# Patient Record
Sex: Male | Born: 1990 | Race: Black or African American | Hispanic: No | Marital: Married | Smoking: Never smoker
Health system: Southern US, Community
[De-identification: ages and names within clinical notes are randomized; demographics above are authoritative.]

## PROBLEM LIST (undated history)

## (undated) HISTORY — PX: WRIST SURGERY: SHX841

## (undated) HISTORY — PX: KNEE SURGERY: SHX244

---
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
## 2014-12-02 ENCOUNTER — Emergency Department (HOSPITAL_BASED_OUTPATIENT_CLINIC_OR_DEPARTMENT_OTHER)
Admission: EM | Admit: 2014-12-02 | Discharge: 2014-12-02 | Disposition: A | Discharge status: Home / Self Care | Payer: Self-pay | Attending: Emergency Medicine | Admitting: Emergency Medicine

## 2014-12-02 ENCOUNTER — Encounter (HOSPITAL_BASED_OUTPATIENT_CLINIC_OR_DEPARTMENT_OTHER): Payer: Self-pay

## 2014-12-02 DIAGNOSIS — Z880 Allergy status to penicillin: Secondary | ICD-10-CM | POA: Insufficient documentation

## 2014-12-02 DIAGNOSIS — R0982 Postnasal drip: Secondary | ICD-10-CM | POA: Insufficient documentation

## 2014-12-02 DIAGNOSIS — J069 Acute upper respiratory infection, unspecified: Secondary | ICD-10-CM | POA: Insufficient documentation

## 2014-12-02 MED ORDER — BENZONATATE 100 MG PO CAPS
200.0000 mg | ORAL_CAPSULE | Freq: Once | ORAL | Status: AC
  Administered 2014-12-02: 200 mg via ORAL
  Filled 2014-12-02: qty 2

## 2014-12-02 MED ORDER — GUAIFENESIN 400 MG PO TABS: 400.0000 mg | ORAL_TABLET | Freq: Four times a day (QID) | ORAL | Status: DC

## 2014-12-02 MED ORDER — IBUPROFEN 800 MG PO TABS
800.0000 mg | ORAL_TABLET | Freq: Once | ORAL | Status: AC
  Administered 2014-12-02: 800 mg via ORAL
  Filled 2014-12-02: qty 1

## 2014-12-02 MED ORDER — LORATADINE 10 MG PO TABS
10.0000 mg | ORAL_TABLET | Freq: Once | ORAL | Status: AC
  Administered 2014-12-02: 10 mg via ORAL
  Filled 2014-12-02: qty 1

## 2014-12-02 MED ORDER — BENZONATATE 100 MG PO CAPS: 100.0000 mg | ORAL_CAPSULE | Freq: Three times a day (TID) | ORAL | Status: DC

## 2014-12-02 MED ORDER — FLUTICASONE PROPIONATE 50 MCG/ACT NA SUSP: 2.0000 | Freq: Every day | NASAL | Status: DC

## 2014-12-02 MED ORDER — LORATADINE 10 MG PO TABS
ORAL_TABLET | ORAL | Status: AC
  Filled 2014-12-02: qty 1

## 2014-12-02 MED ORDER — CETIRIZINE-PSEUDOEPHEDRINE ER 5-120 MG PO TB12: 1.0000 | ORAL_TABLET | Freq: Two times a day (BID) | ORAL | Status: DC

## 2014-12-02 NOTE — ED Provider Notes (Signed)
CSN: [id number]     Arrival date & time 12/02/14  0106 History   First MD Initiated Contact with Patient 12/02/14 0124     Chief Complaint  Patient presents with  . Cough     (Consider location/radiation/quality/duration/timing/severity/associated sxs/prior Treatment) Patient is a 24 y.o. male presenting with URI. The history is provided by the patient.  URI Presenting symptoms: congestion and rhinorrhea   Presenting symptoms: no fever and no sore throat   Congestion:    Location:  Nasal   Interferes with sleep: no     Interferes with eating/drinking: no   Severity:  Moderate Onset quality:  Gradual Duration:  2 days Timing:  Constant Progression:  Unchanged Chronicity:  New Relieved by:  Nothing Worsened by:  Nothing tried Ineffective treatments:  OTC medications (theraflu) Associated symptoms: no headaches, no myalgias, no neck pain, no swollen glands and no wheezing   Risk factors: not elderly   Also dry cough worse lying flat  History reviewed. No pertinent past medical history. History reviewed. No pertinent past surgical history. History reviewed. No pertinent family history. History  Substance Use Topics  . Smoking status: Never Smoker   . Smokeless tobacco: Not on file  . Alcohol Use: Yes     Comment: social    Review of Systems  Constitutional: Negative for fever.  HENT: Positive for congestion and rhinorrhea. Negative for facial swelling and sore throat.   Respiratory: Negative for shortness of breath and wheezing.   Cardiovascular: Negative for chest pain.  Musculoskeletal: Negative for myalgias and neck pain.  Neurological: Negative for headaches.  All other systems reviewed and are negative.     Allergies  Amoxicillin  Home Medications   Prior to Admission medications   Not on File   BP 140/59 mmHg  Pulse 84  Temp(Src) 98.6 F (37 C) (Oral)  Resp 20  Ht 6\' 1"  (1.854 m)  Wt 280 lb (127.007 kg)  BMI 36.95 kg/m2  SpO2 98% Physical Exam   Constitutional: He is oriented to person, place, and time. He appears well-developed and well-nourished. No distress.  HENT:  Head: Normocephalic and atraumatic.  Right Ear: External ear normal.  Left Ear: External ear normal.  Mouth/Throat: Oropharynx is clear and moist.  Clear colorless post nasal drip  Eyes: Conjunctivae are normal. Pupils are equal, round, and reactive to light.  Neck: Normal range of motion. Neck supple.  Cardiovascular: Normal rate, regular rhythm and intact distal pulses.   Pulmonary/Chest: Effort normal and breath sounds normal. No stridor. No respiratory distress. He has no wheezes. He has no rales.  Abdominal: Soft. Bowel sounds are normal. There is no tenderness. There is no rebound and no guarding.  Musculoskeletal: Normal range of motion.  Lymphadenopathy:    He has no cervical adenopathy.  Neurological: He is alert and oriented to person, place, and time.  Skin: Skin is warm and dry.  Psychiatric: He has a normal mood and affect.    ED Course  Procedures (including critical care time) Labs Review Labs Reviewed - No data to display  Imaging Review No results found.   EKG Interpretation None      MDM   Final diagnoses:  None    URI with post nasal drip.  Will treat symptomatically with decongestant, flonase and tessalon.  Return for fevers chills stiff neck or any concerns    Camil Wilhelmsen K Makelle Marrone-Rasch, MD 12/02/14 [phone]

## 2014-12-02 NOTE — ED Notes (Signed)
Pt c/o cough x3days, taken OTC meds with no relief, states coughing till he vomits

## 2015-06-13 ENCOUNTER — Encounter (HOSPITAL_BASED_OUTPATIENT_CLINIC_OR_DEPARTMENT_OTHER): Payer: Self-pay | Admitting: *Deleted

## 2015-06-13 ENCOUNTER — Emergency Department (HOSPITAL_BASED_OUTPATIENT_CLINIC_OR_DEPARTMENT_OTHER): Payer: Self-pay

## 2015-06-13 ENCOUNTER — Emergency Department (HOSPITAL_BASED_OUTPATIENT_CLINIC_OR_DEPARTMENT_OTHER)
Admission: EM | Admit: 2015-06-13 | Discharge: 2015-06-13 | Disposition: A | Discharge status: Home / Self Care | Payer: Self-pay | Attending: Emergency Medicine | Admitting: Emergency Medicine

## 2015-06-13 DIAGNOSIS — S93601A Unspecified sprain of right foot, initial encounter: Secondary | ICD-10-CM | POA: Insufficient documentation

## 2015-06-13 DIAGNOSIS — Y9362 Activity, american flag or touch football: Secondary | ICD-10-CM | POA: Insufficient documentation

## 2015-06-13 DIAGNOSIS — Y998 Other external cause status: Secondary | ICD-10-CM | POA: Insufficient documentation

## 2015-06-13 DIAGNOSIS — Z880 Allergy status to penicillin: Secondary | ICD-10-CM | POA: Insufficient documentation

## 2015-06-13 DIAGNOSIS — Z7951 Long term (current) use of inhaled steroids: Secondary | ICD-10-CM | POA: Insufficient documentation

## 2015-06-13 DIAGNOSIS — X58XXXA Exposure to other specified factors, initial encounter: Secondary | ICD-10-CM | POA: Insufficient documentation

## 2015-06-13 DIAGNOSIS — Z79899 Other long term (current) drug therapy: Secondary | ICD-10-CM | POA: Insufficient documentation

## 2015-06-13 DIAGNOSIS — Y92321 Football field as the place of occurrence of the external cause: Secondary | ICD-10-CM | POA: Insufficient documentation

## 2015-06-13 MED ORDER — NAPROXEN 500 MG PO TABS: 500.0000 mg | ORAL_TABLET | Freq: Two times a day (BID) | ORAL | Status: DC

## 2015-06-13 NOTE — ED Notes (Signed)
Right foot injury while playing football last week.

## 2015-06-13 NOTE — ED Provider Notes (Signed)
CSN: [id number]     Arrival date & time 06/13/15  1734 History   First MD Initiated Contact with Patient 06/13/15 1851     Chief Complaint  Patient presents with  . Foot Injury     (Consider location/radiation/quality/duration/timing/severity/associated sxs/prior Treatment) HPI Martin Sullivan is a 24 y.o. male with no medical problems, presents to ED with right foot pain. Patient states he is playing football and twisted his foot a week ago. Patient states that it has been swollen and painful since then. States he is able to walk on it. He denies taking any medications for it. No treatment prior to coming in. States "it's not getting any better so decided to come to get it checked out." Denies any numbness or weakness. There is no other injuries.   History reviewed. No pertinent past medical history. Past Surgical History  Procedure Laterality Date  . Wrist surgery    . Knee surgery      x2   No family history on file. History  Substance Use Topics  . Smoking status: Never Smoker   . Smokeless tobacco: Not on file  . Alcohol Use: No     Comment: social    Review of Systems  Constitutional: Negative for fever and chills.  Musculoskeletal: Positive for joint swelling and arthralgias.  Neurological: Negative for weakness and numbness.      Allergies  Amoxicillin  Home Medications   Prior to Admission medications   Medication Sig Start Date End Date Taking? Authorizing Provider  benzonatate (TESSALON) 100 MG capsule Take 1 capsule (100 mg total) by mouth every 8 (eight) hours. 12/02/14   April Palumbo, MD  cetirizine-pseudoephedrine (ZYRTEC-D) 5-120 MG per tablet Take 1 tablet by mouth 2 (two) times daily. 12/02/14   April Palumbo, MD  fluticasone [facility]) 50 MCG/ACT nasal spray Place 2 sprays into both nostrils daily. 12/02/14   April Palumbo, MD  guaifenesin (HUMIBID E) 400 MG TABS tablet Take 1 tablet (400 mg total) by mouth every 6 (six) hours. 12/02/14   April Palumbo,  MD   BP 112/82 mmHg  Pulse 64  Temp(Src) 98.7 F (37.1 C) (Oral)  Resp 18  Ht 6\' 2"  (1.88 m)  Wt 270 lb (122.471 kg)  BMI 34.65 kg/m2  SpO2 98% Physical Exam  Constitutional: He is oriented to person, place, and time. He appears well-developed and well-nourished. No distress.  HENT:  Head: Normocephalic.  Eyes: Conjunctivae are normal.  Neck: Neck supple.  Musculoskeletal:  Mild swelling noted to dorsal right foot. TTP over 2nd and 3rd metatarsals. Pain with flexion and extension of 2nd and 3rd toes at MTP joints. dp pulses intact  Neurological: He is alert and oriented to person, place, and time.  Skin: Skin is warm and dry.  Nursing note and vitals reviewed.   ED Course  Procedures (including critical care time) Labs Review Labs Reviewed - No data to display  Imaging Review Dg Foot Complete Right  06/13/2015   CLINICAL DATA:  Right foot injury 8 days ago playing football-got better last week then played basketball and foot started swelling again; pain is distal tarsals  EXAM: RIGHT FOOT COMPLETE - 3+ VIEW  COMPARISON:  None.  FINDINGS: There is no evidence of fracture or dislocation. There is no evidence of arthropathy or other focal bone abnormality. Soft tissues are unremarkable.  IMPRESSION: Negative.   Electronically Signed   By: Esperanza Heir M.D.   On: 06/13/2015 18:35     EKG Interpretation None  MDM   Final diagnoses:  Right foot sprain, initial encounter     Patient is here with foot pain and swelling, after twisting injury one week ago. X-rays negative. Most likely a foot sprain. Patient is able to or on the foot. Ace wrap applied, home with naproxen, elevation, Ace, follow-up with orthopedic specialist.  Filed Vitals:   06/13/15 1754 06/13/15 1950  BP: 112/82 144/77  Pulse: 64 88  Temp: 98.7 F (37.1 C)   TempSrc: Oral   Resp: 18   Height:  (1.88 m)   Weight: 270 lb (122.471 kg)   SpO2: 98% 100%     Jaynie Crumble,  PA-C 06/14/15 0105  Jerelyn Scott, MD 06/14/15 1513

## 2015-06-13 NOTE — Discharge Instructions (Signed)
Keep your foot elevated. Ice several times a day. Naprosyn for pain and inflammation. Follow up with orthopedics.    Foot Sprain The muscles and cord like structures which attach muscle to bone (tendons) that surround the feet are made up of units. A foot sprain can occur at the weakest spot in any of these units. This condition is most often caused by injury to or overuse of the foot, as from playing contact sports, or aggravating a previous injury, or from poor conditioning, or obesity. SYMPTOMS  Pain with movement of the foot.  Tenderness and swelling at the injury site.  Loss of strength is present in moderate or severe sprains. THE THREE GRADES OR SEVERITY OF FOOT SPRAIN ARE:  Mild (Grade I): Slightly pulled muscle without tearing of muscle or tendon fibers or loss of strength.  Moderate (Grade II): Tearing of fibers in a muscle, tendon, or at the attachment to bone, with small decrease in strength.  Severe (Grade III): Rupture of the muscle-tendon-bone attachment, with separation of fibers. Severe sprain requires surgical repair. Often repeating (chronic) sprains are caused by overuse. Sudden (acute) sprains are caused by direct injury or over-use. DIAGNOSIS  Diagnosis of this condition is usually by your own observation. If problems continue, a caregiver may be required for further evaluation and treatment. X-rays may be required to make sure there are not breaks in the bones (fractures) present. Continued problems may require physical therapy for treatment. PREVENTION  Use strength and conditioning exercises appropriate for your sport.  Warm up properly prior to working out.  Use athletic shoes that are made for the sport you are participating in.  Allow adequate time for healing. Early return to activities makes repeat injury more likely, and can lead to an unstable arthritic foot that can result in prolonged disability. Mild sprains generally heal in 3 to 10 days, with moderate  and severe sprains taking 2 to 10 weeks. Your caregiver can help you determine the proper time required for healing. HOME CARE INSTRUCTIONS   Apply ice to the injury for 15-20 minutes, 03-04 times per day. Put the ice in a plastic bag and place a towel between the bag of ice and your skin.  An elastic wrap (like an Ace bandage) may be used to keep swelling down.  Keep foot above the level of the heart, or at least raised on a footstool, when swelling and pain are present.  Try to avoid use other than gentle range of motion while the foot is painful. Do not resume use until instructed by your caregiver. Then begin use gradually, not increasing use to the point of pain. If pain does develop, decrease use and continue the above measures, gradually increasing activities that do not cause discomfort, until you gradually achieve normal use.  Use crutches if and as instructed, and for the length of time instructed.  Keep injured foot and ankle wrapped between treatments.  Massage foot and ankle for comfort and to keep swelling down. Massage from the toes up towards the knee.  Only take over-the-counter or prescription medicines for pain, discomfort, or fever as directed by your caregiver. SEEK IMMEDIATE MEDICAL CARE IF:   Your pain and swelling increase, or pain is not controlled with medications.  You have loss of feeling in your foot or your foot turns cold or blue.  You develop new, unexplained symptoms, or an increase of the symptoms that brought you to your caregiver. MAKE SURE YOU:   Understand these instructions.  Will watch your condition.  Will get help right away if you are not doing well or get worse. Document Released: 04/20/2002 Document Revised: 01/21/2012 Document Reviewed: 06/17/2008 [facility] Patient Information 2015 [place], [place]. This information is not intended to replace advice given to you by your health care provider. Make sure you discuss any questions you have with  your health care provider.

## 2017-01-04 ENCOUNTER — Emergency Department (HOSPITAL_BASED_OUTPATIENT_CLINIC_OR_DEPARTMENT_OTHER)
Admission: EM | Admit: 2017-01-04 | Discharge: 2017-01-04 | Disposition: A | Discharge status: Home / Self Care | Payer: Self-pay | Attending: Emergency Medicine | Admitting: Emergency Medicine

## 2017-01-04 ENCOUNTER — Encounter (HOSPITAL_BASED_OUTPATIENT_CLINIC_OR_DEPARTMENT_OTHER): Payer: Self-pay

## 2017-01-04 DIAGNOSIS — A084 Viral intestinal infection, unspecified: Secondary | ICD-10-CM | POA: Insufficient documentation

## 2017-01-04 MED ORDER — ONDANSETRON 8 MG PO TBDP
8.0000 mg | ORAL_TABLET | Freq: Once | ORAL | Status: AC
  Administered 2017-01-04: 8 mg via ORAL
  Filled 2017-01-04: qty 1

## 2017-01-04 MED ORDER — LOPERAMIDE HCL 2 MG PO TABS: ORAL_TABLET | ORAL | Status: AC

## 2017-01-04 MED ORDER — LOPERAMIDE HCL 2 MG PO CAPS
4.0000 mg | ORAL_CAPSULE | Freq: Once | ORAL | Status: AC
  Administered 2017-01-04: 4 mg via ORAL
  Filled 2017-01-04: qty 2

## 2017-01-04 MED ORDER — ONDANSETRON 8 MG PO TBDP
8.0000 mg | ORAL_TABLET | Freq: Three times a day (TID) | ORAL | 0 refills | Status: AC | PRN
Start: 2017-01-04 — End: ?

## 2017-01-04 NOTE — ED Provider Notes (Signed)
   [facility] Provider Note: Martin DellJ. Martin Daisja Kessinger, MD, FACEP  CSN: [id number] MRN: [id number] ARRIVAL: 01/04/17 at 0133 ROOM: MH09/MH09   CHIEF COMPLAINT  Vomiting   HISTORY OF PRESENT ILLNESS  Martin Sullivan is a 26 y.o. male Martin Sullivan had gastroenteritis for the past several days. He is here with nausea, vomiting and diarrhea that began yesterday evening about 7 PM. He has vomited 4 times and has had diarrhea 3 times. He has been able to keep fluids down. He has had some mild abdominal cramping that is relieved by vomiting. He denies fever. He was given Zofran ODT by nursing staff on arrival.   History reviewed. No pertinent past medical history.  Past Surgical History:  Procedure Laterality Date  . KNEE SURGERY     x2  . WRIST SURGERY      No family history on file.  Social History  Substance Use Topics  . Smoking status: Never Smoker  . Smokeless tobacco: Not on file  . Alcohol use No     Comment: social    Prior to Admission medications   Not on File    Allergies Amoxicillin   REVIEW OF SYSTEMS  Negative except as noted here or in the History of Present Illness.   PHYSICAL EXAMINATION  Initial Vital Signs Blood pressure 168/87, pulse 80, temperature 98.3 F (36.8 C), temperature source Oral, resp. rate 18, height 6\' 2"  (1.88 m), weight 295 lb (133.8 kg), SpO2 100 %.  Examination General: Well-developed, well-nourished male in no acute distress; appearance consistent with age of record HENT: normocephalic; atraumatic Eyes: pupils equal, round and reactive to light; extraocular muscles intact Neck: supple Heart: regular rate and rhythm Lungs: clear to auscultation bilaterally Abdomen: soft; nondistended; nontender; no masses or hepatosplenomegaly; bowel sounds present Extremities: No deformity; full range of motion; pulses normal Neurologic: Awake, alert and oriented; motor function intact in all extremities and symmetric; no facial droop Skin:  Warm and dry Psychiatric: Normal mood and affect   RESULTS  Summary of this visit's results, reviewed by myself:   EKG Interpretation  Date/Time:    Ventricular Rate:    PR Interval:    QRS Duration:   QT Interval:    QTC Calculation:   R Axis:     Text Interpretation:        Laboratory Studies: No results found for this or any previous visit (from the past 24 hour(s)). Imaging Studies: No results found.  ED COURSE  Nursing notes and initial vitals signs, including pulse oximetry, reviewed.  Vitals:   01/04/17 0142  BP: 168/87  Pulse: 80  Resp: 18  Temp: 98.3 F (36.8 C)  TempSrc: Oral  SpO2: 100%  Weight: 295 lb (133.8 kg)  Height: 6\' 2"  (1.88 m)    PROCEDURES    ED DIAGNOSES     ICD-9-CM ICD-10-CM   1. Viral gastroenteritis 008.8 A08.4        Martin LibraJohn Mika Griffitts, MD 01/04/17 [id number]

## 2017-01-04 NOTE — ED Triage Notes (Signed)
Pt c/o 4 episodes of vomiting with diarrhea for about 4 hours.  His whole family has the same thing.  Pt has been able to tolerate water and powerade without throwing up, no fevers.

## 2017-01-04 NOTE — ED Notes (Signed)
Pt verbalizes understanding of d/c instructions and denies any further need at this time. 

## 2019-08-05 ENCOUNTER — Other Ambulatory Visit: Payer: Self-pay

## 2019-08-05 ENCOUNTER — Encounter (HOSPITAL_BASED_OUTPATIENT_CLINIC_OR_DEPARTMENT_OTHER): Payer: Self-pay

## 2019-08-05 ENCOUNTER — Emergency Department (HOSPITAL_BASED_OUTPATIENT_CLINIC_OR_DEPARTMENT_OTHER)
Admission: EM | Admit: 2019-08-05 | Discharge: 2019-08-05 | Disposition: A | Discharge status: Home / Self Care | Payer: Self-pay | Attending: Emergency Medicine | Admitting: Emergency Medicine

## 2019-08-05 DIAGNOSIS — M545 Low back pain, unspecified: Secondary | ICD-10-CM

## 2019-08-05 DIAGNOSIS — Z79899 Other long term (current) drug therapy: Secondary | ICD-10-CM | POA: Insufficient documentation

## 2019-08-05 MED ORDER — METHOCARBAMOL 500 MG PO TABS: 500.0000 mg | ORAL_TABLET | Freq: Every evening | ORAL | 0 refills | Status: AC | PRN

## 2019-08-05 MED ORDER — NAPROXEN 250 MG PO TABS
500.0000 mg | ORAL_TABLET | Freq: Once | ORAL | Status: AC
  Administered 2019-08-05: 500 mg via ORAL
  Filled 2019-08-05: qty 2

## 2019-08-05 MED ORDER — NAPROXEN 500 MG PO TABS: 500.0000 mg | ORAL_TABLET | Freq: Two times a day (BID) | ORAL | 0 refills | Status: AC

## 2019-08-05 MED ORDER — LIDOCAINE 5 % EX PTCH
1.0000 | MEDICATED_PATCH | CUTANEOUS | Status: DC
  Filled 2019-08-05: qty 1

## 2019-08-05 NOTE — Discharge Instructions (Signed)
Take naproxen 2 times a day with meals.  Do not take other anti-inflammatories at the same time (Advil, Motrin, ibuprofen, Aleve). You may supplement with Tylenol if you need further pain control. Use Robaxin as needed for muscle stiffness or soreness. Have caution, as this may make you tired or groggy. Do not drive or operate heavy machinery while taking this medication.  Use muscle creams (bengay, icy hot, salonpas) as needed for pain.  Use heat/ice to help with your pain.  Do the stretches in the paperwork 2 times a day.  Follow up with the orthopedic doctor if pain is not improving with this treatment.  Return to the ER if you develop high fevers, numbness, loss of bowel or bladder control, or any new or concerning symptoms.

## 2019-08-05 NOTE — ED Triage Notes (Addendum)
Pt c/o lower back pain x 1.5 weeks-denies injury-pain worse with movement-NAD-steady gait

## 2019-08-05 NOTE — ED Provider Notes (Signed)
[facility] EMERGENCY DEPARTMENT Provider Note   CSN: [id number] Arrival date & time: 08/05/19  1355     History   Chief Complaint Chief Complaint  Patient presents with  . Back Pain    HPI Martin Sullivan is a 28 y.o. male presenting for evaluation of back pain.   Patient states for the past 1-1/2 weeks, he has had gradually worsening low back pain.  It is on his left side.  It does not radiate anywhere.  He denies fall, trauma, or injury.  Patient states he does a lot of heavy lifting for work, but no known precipitating event.  He took 1 dose of Flexeril without improvement of symptoms, he has not tried anything else.  Sitting and movement makes the pain worse, nothing makes it better.  He had something similar earlier this month which resolved without intervention.  Otherwise, no history of back pain.  He denies fevers, chills, nausea, vomiting, urinary symptoms, numbness, tingling, loss of bowel bladder control, history of cancer, history of IVDU.     HPI  History reviewed. No pertinent past medical history.  There are no active problems to display for this patient.   Past Surgical History:  Procedure Laterality Date  . KNEE SURGERY     x2  . WRIST SURGERY          Home Medications    Prior to Admission medications   Medication Sig Start Date End Date Taking? Authorizing Provider  loperamide (IMODIUM A-D) 2 MG tablet Take per package as structures as needed for diarrhea. 01/04/17   Molpus, Jonny Ruiz, MD  methocarbamol (ROBAXIN) 500 MG tablet Take 1 tablet (500 mg total) by mouth at bedtime as needed for muscle spasms. 08/05/19   Mckensi Redinger, PA-C  naproxen (NAPROSYN) 500 MG tablet Take 1 tablet (500 mg total) by mouth 2 (two) times daily with a meal. 08/05/19   Omar Gayden, PA-C  ondansetron (ZOFRAN ODT) 8 MG disintegrating tablet Take 1 tablet (8 mg total) by mouth every 8 (eight) hours as needed for nausea or vomiting. 01/04/17   Molpus, Jonny Ruiz, MD     Family History No family history on file.  Social History Social History   Tobacco Use  . Smoking status: Never Smoker  . Smokeless tobacco: Never Used  Substance Use Topics  . Alcohol use: Yes    Comment: occ  . Drug use: No     Allergies   Amoxicillin   Review of Systems Review of Systems  Constitutional: Negative for fever.  Musculoskeletal: Positive for back pain.     Physical Exam Updated Vital Signs BP 132/75 (BP Location: Left Arm)   Pulse 70   Temp 99 F (37.2 C) (Oral)   Resp 20   Ht 6\' 1"  (1.854 m)   Wt (!) 137.4 kg   SpO2 100%   BMI 39.98 kg/m   Physical Exam Vitals signs and nursing note reviewed.  Constitutional:      General: He is not in acute distress.    Appearance: He is well-developed.     Comments: Appears nontoxic  HENT:     Head: Normocephalic and atraumatic.  Eyes:     Conjunctiva/sclera: Conjunctivae normal.     Pupils: Pupils are equal, round, and reactive to light.  Neck:     Musculoskeletal: Normal range of motion and neck supple.  Cardiovascular:     Rate and Rhythm: Normal rate and regular rhythm.  Pulmonary:     Effort: Pulmonary effort is  normal. No respiratory distress.     Breath sounds: Normal breath sounds. No wheezing.  Abdominal:     General: There is no distension.     Palpations: Abdomen is soft.     Tenderness: There is no abdominal tenderness.  Musculoskeletal: Normal range of motion.     Comments: Tenderness palpation of left low back musculature.  No increased pain over midline spine.  No step-offs or deformities.  No tenderness palpation of the right side.  Patient is ambulatory no difficulty.  Active range of motion of the hip/back with pain of all movements.   Skin:    General: Skin is warm and dry.     Capillary Refill: Capillary refill takes less than 2 seconds.  Neurological:     Mental Status: He is alert and oriented to person, place, and time.      ED Treatments / Results  Labs (all labs  ordered are listed, but only abnormal results are displayed) Labs Reviewed - No data to display  EKG None  Radiology No results found.  Procedures Procedures (including critical care time)  Medications Ordered in ED Medications  lidocaine (LIDODERM) 5 % 1 patch (has no administration in time range)  naproxen (NAPROSYN) tablet 500 mg (500 mg Oral Given 08/05/19 1540)     Initial Impression / Assessment and Plan / ED Course  I have reviewed the triage vital signs and the nursing notes.  Pertinent labs & imaging results that were available during my care of the patient were reviewed by me and considered in my medical decision making (see chart for details).        Patient presenting for evaluation of low back pain.  Physical exam reassuring, neurovascularly intact.  No red flags for back pain.  Pain is reproducible with palpation of the musculature.  Likely musculoskeletal.  Doubt fracture, I do not believe x-rays will be beneficial.  Doubt vertebral injury, infection, spinal cord compression, myelopathy, or cauda equina syndrome.  Will treat symptomatically with NSAIDs, muscle relaxers, muscle creams.  Patient given information to follow-up with orthopedics.  At this time, patient appears safe for discharge.  Return precautions given.  Patient states he understands agrees plan.  Final Clinical Impressions(s) / ED Diagnoses   Final diagnoses:  Acute left-sided low back pain without sciatica    ED Discharge Orders         Ordered    naproxen (NAPROSYN) 500 MG tablet  2 times daily with meals     08/05/19 1534    methocarbamol (ROBAXIN) 500 MG tablet  At bedtime PRN     08/05/19 1534           Laconda Basich, PA-C 08/05/19 1814    Julianne Rice, MD 08/05/19 2008

## 2019-10-16 ENCOUNTER — Other Ambulatory Visit: Payer: Self-pay

## 2019-10-16 DIAGNOSIS — Z20822 Contact with and (suspected) exposure to covid-19: Secondary | ICD-10-CM

## 2019-10-18 ENCOUNTER — Telehealth: Payer: Self-pay

## 2019-10-18 NOTE — Telephone Encounter (Signed)
Pt's wife called for results. Advised wife that results are not back.

## 2019-10-20 LAB — NOVEL CORONAVIRUS, NAA: SARS-CoV-2, NAA: NOT DETECTED

## 2020-02-07 ENCOUNTER — Emergency Department (HOSPITAL_BASED_OUTPATIENT_CLINIC_OR_DEPARTMENT_OTHER): Payer: Self-pay

## 2020-02-07 ENCOUNTER — Other Ambulatory Visit: Payer: Self-pay

## 2020-02-07 ENCOUNTER — Encounter (HOSPITAL_BASED_OUTPATIENT_CLINIC_OR_DEPARTMENT_OTHER): Payer: Self-pay | Admitting: Emergency Medicine

## 2020-02-07 ENCOUNTER — Emergency Department (HOSPITAL_BASED_OUTPATIENT_CLINIC_OR_DEPARTMENT_OTHER)
Admission: EM | Admit: 2020-02-07 | Discharge: 2020-02-07 | Disposition: A | Discharge status: Home / Self Care | Payer: Self-pay | Attending: Emergency Medicine | Admitting: Emergency Medicine

## 2020-02-07 DIAGNOSIS — Y9361 Activity, american tackle football: Secondary | ICD-10-CM | POA: Insufficient documentation

## 2020-02-07 DIAGNOSIS — Y929 Unspecified place or not applicable: Secondary | ICD-10-CM | POA: Insufficient documentation

## 2020-02-07 DIAGNOSIS — M79661 Pain in right lower leg: Secondary | ICD-10-CM | POA: Insufficient documentation

## 2020-02-07 DIAGNOSIS — W500XXA Accidental hit or strike by another person, initial encounter: Secondary | ICD-10-CM | POA: Insufficient documentation

## 2020-02-07 DIAGNOSIS — S86112A Strain of other muscle(s) and tendon(s) of posterior muscle group at lower leg level, left leg, initial encounter: Secondary | ICD-10-CM | POA: Insufficient documentation

## 2020-02-07 DIAGNOSIS — S8391XA Sprain of unspecified site of right knee, initial encounter: Secondary | ICD-10-CM | POA: Insufficient documentation

## 2020-02-07 DIAGNOSIS — Y999 Unspecified external cause status: Secondary | ICD-10-CM | POA: Insufficient documentation

## 2020-02-07 LAB — CBC WITH DIFFERENTIAL/PLATELET
Abs Immature Granulocytes: 0.03 10*3/uL (ref 0.00–0.07)
Basophils Absolute: 0 10*3/uL (ref 0.0–0.1)
Basophils Relative: 0 %
Eosinophils Absolute: 0.1 10*3/uL (ref 0.0–0.5)
Eosinophils Relative: 1 %
HCT: 38.9 % — ABNORMAL LOW (ref 39.0–52.0)
Hemoglobin: 12.6 g/dL — ABNORMAL LOW (ref 13.0–17.0)
Immature Granulocytes: 0 %
Lymphocytes Relative: 28 %
Lymphs Abs: 2.5 10*3/uL (ref 0.7–4.0)
MCH: 28.2 pg (ref 26.0–34.0)
MCHC: 32.4 g/dL (ref 30.0–36.0)
MCV: 87 fL (ref 80.0–100.0)
Monocytes Absolute: 1.1 10*3/uL — ABNORMAL HIGH (ref 0.1–1.0)
Monocytes Relative: 12 %
Neutro Abs: 5.3 10*3/uL (ref 1.7–7.7)
Neutrophils Relative %: 59 %
Platelets: 299 10*3/uL (ref 150–400)
RBC: 4.47 MIL/uL (ref 4.22–5.81)
RDW: 13.3 % (ref 11.5–15.5)
WBC: 9.1 10*3/uL (ref 4.0–10.5)
nRBC: 0 % (ref 0.0–0.2)

## 2020-02-07 LAB — BASIC METABOLIC PANEL
Anion gap: 10 (ref 5–15)
BUN: 16 mg/dL (ref 6–20)
CO2: 27 mmol/L (ref 22–32)
Calcium: 9.6 mg/dL (ref 8.9–10.3)
Chloride: 102 mmol/L (ref 98–111)
Creatinine, Ser: 1.13 mg/dL (ref 0.61–1.24)
GFR calc Af Amer: >60 mL/min (ref >60)
GFR calc non Af Amer: >60 mL/min (ref >60)
Glucose, Bld: 95 mg/dL (ref 70–99)
Potassium: 3.5 mmol/L (ref 3.5–5.1)
Sodium: 139 mmol/L (ref 135–145)

## 2020-02-07 MED ORDER — IOHEXOL 350 MG/ML SOLN
100.0000 mL | Freq: Once | INTRAVENOUS | Status: AC | PRN
  Administered 2020-02-07: 08:00:00 100 mL via INTRAVENOUS

## 2020-02-07 MED ORDER — IBUPROFEN 800 MG PO TABS
800.0000 mg | ORAL_TABLET | Freq: Once | ORAL | Status: AC
Start: 2020-02-07 — End: 2020-02-07
  Administered 2020-02-07: 800 mg via ORAL
  Filled 2020-02-07: qty 1

## 2020-02-07 NOTE — ED Notes (Signed)
Order for CTA Lower extremity LEFT added to original CTA Lower extremity RIGHT order per [facility] Radiology Protocol to evaluate for traumatic vascular injury. Protocol pulled from technologist website and scanned into epic for reference. This exam must be a bilateral exam.

## 2020-02-07 NOTE — ED Provider Notes (Signed)
[facility] Provider Note: [name], MD, FACEP  CSN: [id number] MRN: [id number] ARRIVAL: 02/07/20 at 0056 ROOM: [facility]   CHIEF COMPLAINT  Knee Injury   HISTORY OF PRESENT ILLNESS  02/07/20 5:55 AM Martin Sullivan is a 29 y.o. male who felt his right knee dislocate at a football game yesterday evening.  He states the knee was struck from the front and hyperextended.  He believes the knee actually dislocated posteriorly.  He does not believe this was just a patellar dislocation.  His knee was splinted by a referee, who is an EMT, at the scene of the injury.  He rated his pain as an 8 out of 10 on arrival but this has improved since.  The pain is primarily located in the distal aspect of the right popliteal fossa.  There is no associated deformity or distal neurologic compromise.   History reviewed. No pertinent past medical history.  Past Surgical History:  Procedure Laterality Date  . KNEE SURGERY     x2  . WRIST SURGERY      No family history on file.  Social History   Tobacco Use  . Smoking status: Never Smoker  . Smokeless tobacco: Never Used  Substance Use Topics  . Alcohol use: Yes    Comment: occ  . Drug use: No    Prior to Admission medications   Medication Sig Start Date End Date Taking? Authorizing Provider  loperamide (IMODIUM A-D) 2 MG tablet Take per package as structures as needed for diarrhea. 01/04/17   Michiah Masse, Jonny Ruiz, MD  methocarbamol (ROBAXIN) 500 MG tablet Take 1 tablet (500 mg total) by mouth at bedtime as needed for muscle spasms. 08/05/19   Caccavale, Sophia, PA-C  naproxen (NAPROSYN) 500 MG tablet Take 1 tablet (500 mg total) by mouth 2 (two) times daily with a meal. 08/05/19   Caccavale, Sophia, PA-C  ondansetron (ZOFRAN ODT) 8 MG disintegrating tablet Take 1 tablet (8 mg total) by mouth every 8 (eight) hours as needed for nausea or vomiting. 01/04/17   Jewell Ryans, MD    Allergies Amoxicillin   REVIEW OF SYSTEMS  Negative  except as noted here or in the History of Present Illness.   PHYSICAL EXAMINATION  Initial Vital Signs Blood pressure 130/67, pulse 67, temperature 98.9 F (37.2 C), temperature source Oral, resp. rate 18, height 6\' 2"  (1.88 m), weight 136.1 kg, SpO2 99 %.  Examination General: Well-developed, well-nourished male in no acute distress; appearance consistent with age of record HENT: normocephalic; atraumatic Eyes: Normal appearance Neck: supple Heart: regular rate and rhythm Lungs: clear to auscultation bilaterally Abdomen: soft; nondistended; nontender; bowel sounds present Extremities: No deformity; pulses normal; patient able to fully extend right knee; tenderness of distal right popliteal fossa with pain at that site on attempted hyperextension, right lower extremity distally neurovascularly intact Neurologic: Awake, alert and oriented; motor function intact in all extremities and symmetric; no facial droop Skin: Warm and dry Psychiatric: Normal mood and affect   RESULTS  Summary of this visit's results, reviewed and interpreted by myself:   EKG Interpretation  Date/Time:    Ventricular Rate:    PR Interval:    QRS Duration:   QT Interval:    QTC Calculation:   R Axis:     Text Interpretation:        Laboratory Studies: Results for orders placed or performed during the hospital encounter of 02/07/20 (from the past 24 hour(s))  CBC with Differential/Platelet     Status:  Abnormal   Collection Time: 02/07/20  6:24 AM  Result Value Ref Range   WBC 9.1 4.0 - 10.5 K/uL   RBC 4.47 4.22 - 5.81 MIL/uL   Hemoglobin 12.6 (L) 13.0 - 17.0 g/dL   HCT 12.8 (L) 78.6 - 76.7 %   MCV 87.0 80.0 - 100.0 fL   MCH 28.2 26.0 - 34.0 pg   MCHC 32.4 30.0 - 36.0 g/dL   RDW 20.9 47.0 - 96.2 %   Platelets 299 150 - 400 K/uL   nRBC 0.0 0.0 - 0.2 %   Neutrophils Relative % 59 %   Neutro Abs 5.3 1.7 - 7.7 K/uL   Lymphocytes Relative 28 %   Lymphs Abs 2.5 0.7 - 4.0 K/uL   Monocytes Relative  12 %   Monocytes Absolute 1.1 (H) 0.1 - 1.0 K/uL   Eosinophils Relative 1 %   Eosinophils Absolute 0.1 0.0 - 0.5 K/uL   Basophils Relative 0 %   Basophils Absolute 0.0 0.0 - 0.1 K/uL   Immature Granulocytes 0 %   Abs Immature Granulocytes 0.03 0.00 - 0.07 K/uL  Basic metabolic panel     Status: None   Collection Time: 02/07/20  6:24 AM  Result Value Ref Range   Sodium 139 135 - 145 mmol/L   Potassium 3.5 3.5 - 5.1 mmol/L   Chloride 102 98 - 111 mmol/L   CO2 27 22 - 32 mmol/L   Glucose, Bld 95 70 - 99 mg/dL   BUN 16 6 - 20 mg/dL   Creatinine, Ser 8.36 0.61 - 1.24 mg/dL   Calcium 9.6 8.9 - 62.9 mg/dL   GFR calc non Af Amer >60 >60 mL/min   GFR calc Af Amer >60 >60 mL/min   Anion gap 10 5 - 15   Imaging Studies: CT ANGIO LOW EXTREM LEFT W &/OR WO CONTRAST  Result Date: 02/07/2020 CLINICAL DATA:  Hyperextension injury playing football. Pain in the popliteal region. EXAM: CT ANGIOGRAPHY OF THE RIGHT LOWEREXTREMITY CT ANGIOGRAPHY OF THE LEFT LOWEREXTREMITY TECHNIQUE: Multidetector CT imaging of the RIGHT LOWERwas performed using the standard protocol during bolus administration of intravenous contrast. Multiplanar CT image reconstructions and MIPs were obtained to evaluate the vascular anatomy. Multidetector CT imaging of the LEFT LOWERwas performed using the standard protocol during bolus administration of intravenous contrast. Multiplanar CT image reconstructions and MIPs were obtained to evaluate the vascular anatomy. CONTRAST:  OMNIPAQUE IOHEXOL 350 MG/ML SOLN COMPARISON:  None. FINDINGS: VASCULAR Aorta: Normal caliber aorta without aneurysm, dissection, vasculitis or significant stenosis. RIGHT Lower Extremity Inflow: Common, internal and external iliac arteries are patent without evidence of aneurysm, dissection, vasculitis or significant stenosis. Outflow: Common, superficial and profunda femoral arteries and the popliteal artery are patent without evidence of aneurysm, dissection,  vasculitis or significant stenosis. Runoff: Patent three vessel runoff to the ankle. LEFT Lower Extremity Inflow: Common, internal and external iliac arteries are patent without evidence of aneurysm, dissection, vasculitis or significant stenosis. Outflow: Common, superficial and profunda femoral arteries and the popliteal artery are patent without evidence of aneurysm, dissection, vasculitis or significant stenosis. Runoff: Patent three vessel runoff to the ankle. Veins: No obvious venous abnormality within the limitations of this arterial phase study. Review of the MIP images confirms the above findings. NON-VASCULAR Bones/Joint/Cartilage No fracture or dislocation. Normal alignment. Small bilateral joint effusions. 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 Joint spaces are otherwise maintained. Ligaments Ligaments are suboptimally evaluated by CT. Muscles and Tendons Small  area of hypodensity in the right proximal lateral gastrocnemius muscle concerning for a small muscle tear or muscle strain. No muscle atrophy. Quadriceps tendon and patellar tendon are intact. Chronic fragmentation at the patellar tendon insertion bilaterally. Soft tissue No fluid collection or hematoma. No soft tissue mass. Soft tissue edema circumferentially in the subcutaneous fat of the right knee most severe posteriorly. IMPRESSION: VASCULAR 1. No acute arterial injury of the right lower extremity. 2. No acute arterial injury of the left lower extremity. NON-VASCULAR 1. Small area of hypodensity in the right proximal lateral gastrocnemius muscle concerning for a small muscle tear or muscle strain. No muscle atrophy. 2. Soft tissue edema circumferentially in the subcutaneous fat of the right knee most severe posteriorly. 3. 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 4. If there is clinical concern regarding  meniscal or ligamentous injury of right knee, recommend further evaluation with an MRI of right knee. Electronically Signed   By: Elige Ko   On: 02/07/2020 08:39   CT ANGIO LOW EXTREM RIGHT W &/OR WO CONTRAST  Result Date: 02/07/2020 CLINICAL DATA:  Hyperextension injury playing football. Pain in the popliteal region. EXAM: CT ANGIOGRAPHY OF THE RIGHT LOWEREXTREMITY CT ANGIOGRAPHY OF THE LEFT LOWEREXTREMITY TECHNIQUE: Multidetector CT imaging of the RIGHT LOWERwas performed using the standard protocol during bolus administration of intravenous contrast. Multiplanar CT image reconstructions and MIPs were obtained to evaluate the vascular anatomy. Multidetector CT imaging of the LEFT LOWERwas performed using the standard protocol during bolus administration of intravenous contrast. Multiplanar CT image reconstructions and MIPs were obtained to evaluate the vascular anatomy. CONTRAST:  OMNIPAQUE IOHEXOL 350 MG/ML SOLN COMPARISON:  None. FINDINGS: VASCULAR Aorta: Normal caliber aorta without aneurysm, dissection, vasculitis or significant stenosis. RIGHT Lower Extremity Inflow: Common, internal and external iliac arteries are patent without evidence of aneurysm, dissection, vasculitis or significant stenosis. Outflow: Common, superficial and profunda femoral arteries and the popliteal artery are patent without evidence of aneurysm, dissection, vasculitis or significant stenosis. Runoff: Patent three vessel runoff to the ankle. LEFT Lower Extremity Inflow: Common, internal and external iliac arteries are patent without evidence of aneurysm, dissection, vasculitis or significant stenosis. Outflow: Common, superficial and profunda femoral arteries and the popliteal artery are patent without evidence of aneurysm, dissection, vasculitis or significant stenosis. Runoff: Patent three vessel runoff to the ankle. Veins: No obvious venous abnormality within the limitations of this arterial phase study. Review of the  MIP images confirms the above findings. NON-VASCULAR Bones/Joint/Cartilage No fracture or dislocation. Normal alignment. Small bilateral joint effusions. 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 Joint spaces are otherwise maintained. Ligaments Ligaments are suboptimally evaluated by CT. Muscles and Tendons Small area of hypodensity in the right proximal lateral gastrocnemius muscle concerning for a small muscle tear or muscle strain. No muscle atrophy. Quadriceps tendon and patellar tendon are intact. Chronic fragmentation at the patellar tendon insertion bilaterally. Soft tissue No fluid collection or hematoma. No soft tissue mass. Soft tissue edema circumferentially in the subcutaneous fat of the right knee most severe posteriorly. IMPRESSION: VASCULAR 1. No acute arterial injury of the right lower extremity. 2. No acute arterial injury of the left lower extremity. NON-VASCULAR 1. Small area of hypodensity in the right proximal lateral gastrocnemius muscle concerning for a small muscle tear or muscle strain. No muscle atrophy. 2. Soft tissue edema circumferentially in the subcutaneous fat of the right knee most severe posteriorly. 3. Moderate-severe osteoarthritis of the left  medial femorotibial compartment with small marginal osteophytes. 4. If there is clinical concern regarding meniscal or ligamentous injury of right knee, recommend further evaluation with an MRI of right knee. Electronically Signed   By: Kathreen Devoid   On: 02/07/2020 08:39   DG Knee Complete 4 Views Right  Result Date: 02/07/2020 CLINICAL DATA:  Initial evaluation for acute knee injury, recent dislocation. EXAM: RIGHT KNEE - COMPLETE 4+ VIEW COMPARISON:  None. FINDINGS: Splinting material overlies the right knee, somewhat limiting assessment for fine osseous detail. No visible acute fracture or dislocation. No obvious joint effusion. Joint spaces maintained without evidence  for degenerative or erosive arthropathy. Osseous mineralization normal. IMPRESSION: No acute osseous abnormality about the knee. Electronically Signed   By: Jeannine Boga M.D.   On: 02/07/2020 01:39    ED COURSE and MDM  Nursing notes, initial and subsequent vitals signs, including pulse oximetry, reviewed and interpreted by myself.  Vitals:   02/07/20 0103 02/07/20 0106 02/07/20 0429 02/07/20 0817  BP:  134/85 130/67 120/79  Pulse:  (!) 58 67 78  Resp:  19 18 16   Temp:  98.9 F (37.2 C)    TempSrc:  Oral    SpO2:  98% 99% 97%  Weight: 136.1 kg     Height: 6\' 2"  (1.88 m)      Medications  iohexol (OMNIPAQUE) 350 MG/ML injection 100 mL (100 mLs Intravenous Contrast Given 02/07/20 0746)  ibuprofen (ADVIL) tablet 800 mg (800 mg Oral Given 02/07/20 0941)   7:00 AM Signed out to Dr. Rex Kras. CT Angio RLE pending to evaluate for popliteal artery injury.   PROCEDURES  Procedures   ED DIAGNOSES     ICD-10-CM   1. Sprain of right knee, unspecified ligament, initial encounter  S83.91XA   2. Gastrocnemius tear, left, initial encounter  G95.621H        Shanon Rosser, MD 02/07/20 2238

## 2020-02-07 NOTE — ED Triage Notes (Signed)
Pt stated he "dislocated" his R knee at a football game this evening.

## 2020-02-07 NOTE — ED Notes (Signed)
Patient transported to CT 

## 2020-02-07 NOTE — ED Provider Notes (Signed)
I received pt in signout from Dr. Read Drivers. At time of signout, awaiting CTA knee to r/o vascular injury due to concern for possible dislocation-relocation. CTA shows possible gastrocnemius tear as well as knee swelling. I discussed findings w/ pt and need for ortho f/u as he may eventually need MRI. Placed in knee brace and gave crutches.  Discussed supportive measures including elevation, ice, NSAIDs.  He voiced understanding.   Terisa Belardo, Ambrose Finland, MD 02/07/20 1013

## 2020-02-29 ENCOUNTER — Other Ambulatory Visit: Payer: Self-pay | Admitting: Student

## 2020-02-29 DIAGNOSIS — M25561 Pain in right knee: Secondary | ICD-10-CM

## 2020-03-09 ENCOUNTER — Ambulatory Visit
Admission: RE | Admit: 2020-03-09 | Discharge: 2020-03-09 | Disposition: A | Discharge status: Home / Self Care | Payer: Self-pay | Source: Ambulatory Visit | Attending: Student | Admitting: Student

## 2020-03-09 ENCOUNTER — Other Ambulatory Visit: Payer: Self-pay

## 2020-03-09 DIAGNOSIS — M25561 Pain in right knee: Secondary | ICD-10-CM

## 2022-02-24 IMAGING — MR MR KNEE*R* W/O CM
4 of 7 series · 18 of 40 positions shown · non-contrast
Comparison: CTA right knee and right knee x-rays dated February 07, 2020.

CLINICAL DATA: Acute knee pain and instability since hyperextension
injury playing football 3 weeks ago.

EXAM:
MRI OF THE RIGHT KNEE WITHOUT CONTRAST
TECHNIQUE: Multiplanar, multisequence MR imaging of the knee was performed. No
intravenous contrast was administered.

[Series 3: T2 fat-sat · axial · 4.0mm · 0.31mm/px · z∈[-55,+41]mm · 3 of 28 slices shown]
[im 6/28]
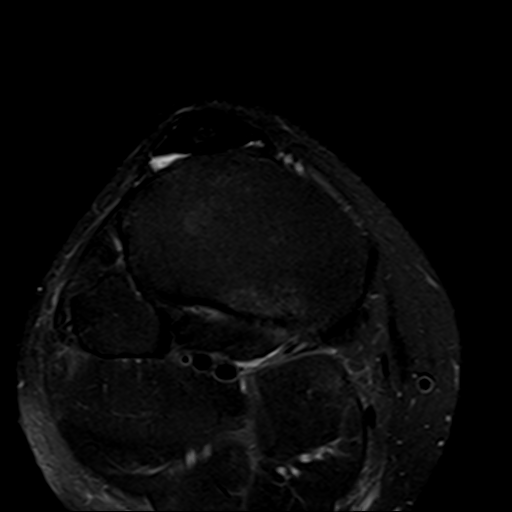
[im 17/28]
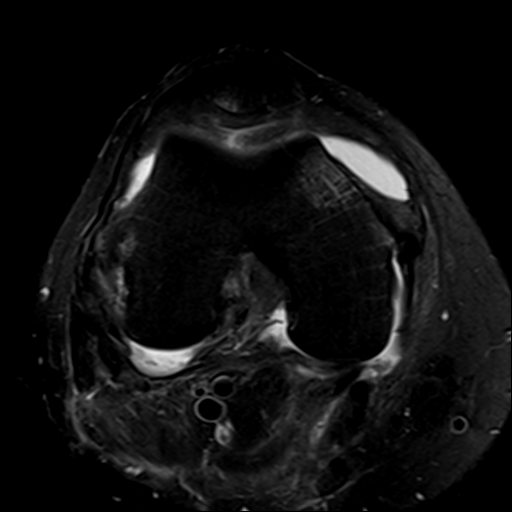
[im 28/28]
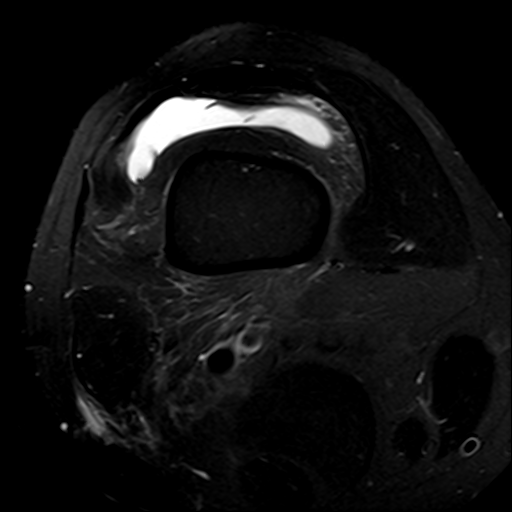

[Series 6: PD fat-sat · coronal · 3.0mm · 0.29mm/px · 7 of 32 slices shown (1 of 3)]
[im 1/32]
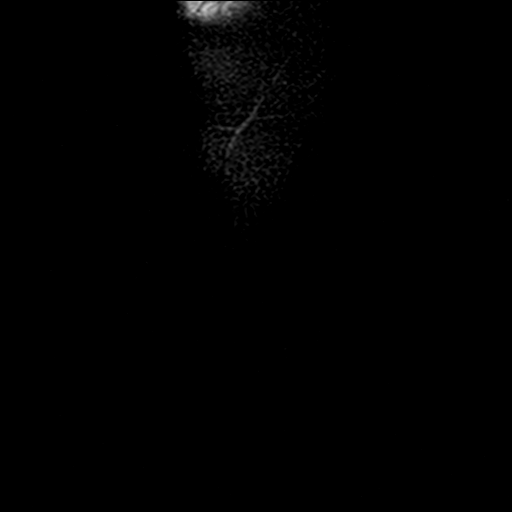
[im 6/32]
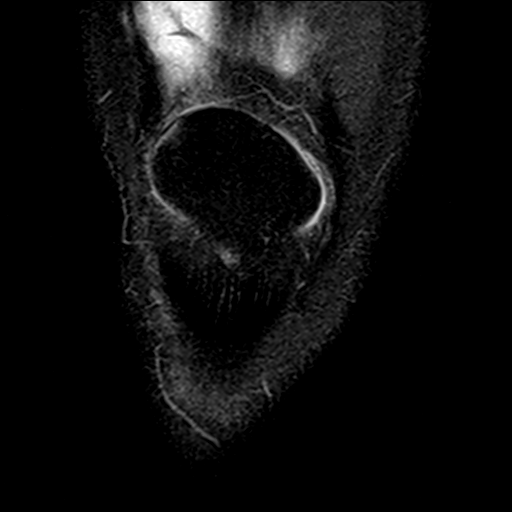
[im 11/32]
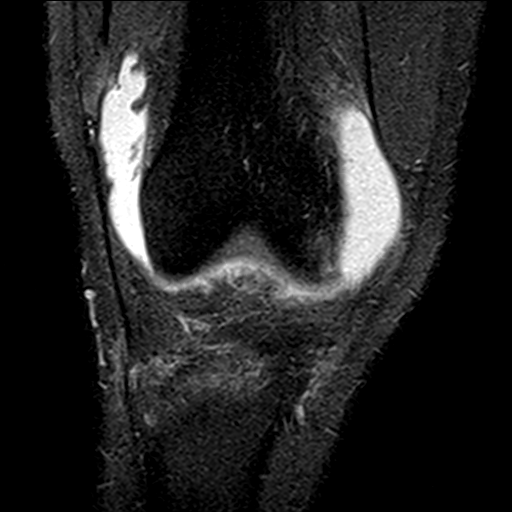
[im 16/32]
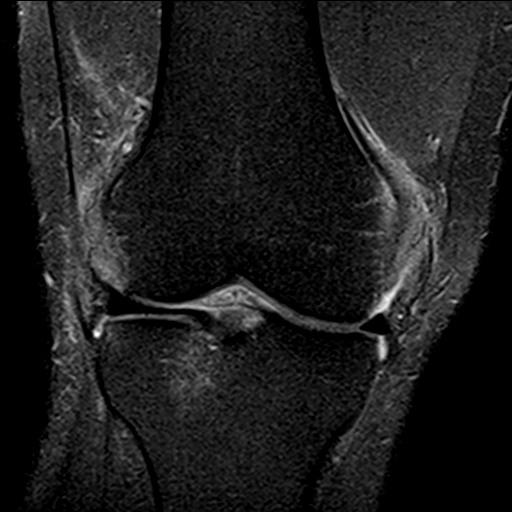
[im 21/32]
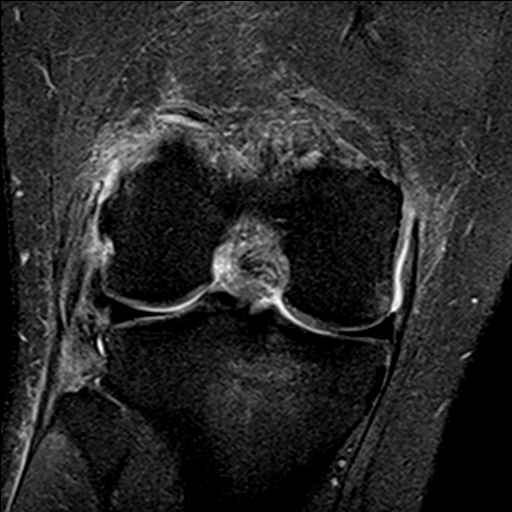
[im 26/32]
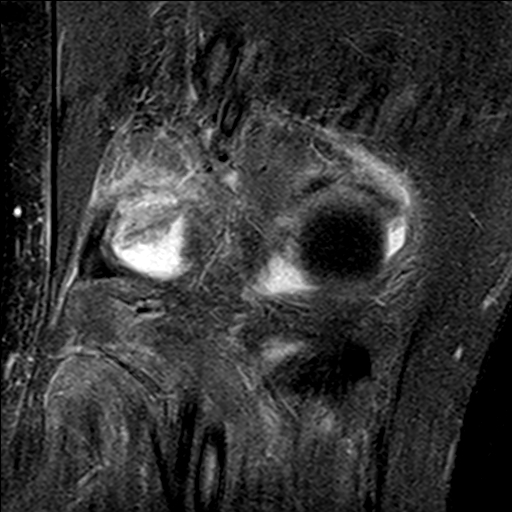
[im 32/32]
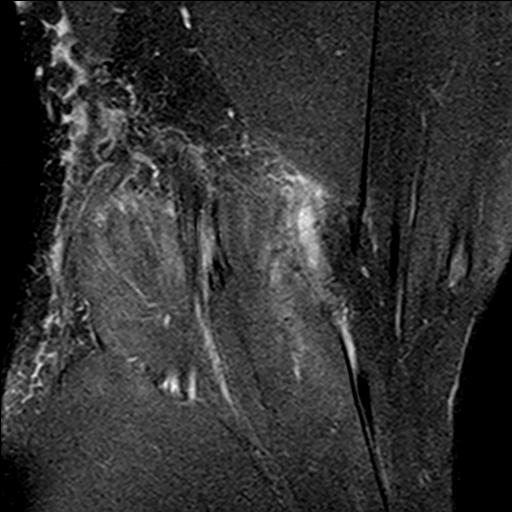

[Series 7: PD fat-sat · sagittal · 3.0mm · 0.29mm/px · 5 of 30 slices shown (2 of 3)]
[im 1/30]
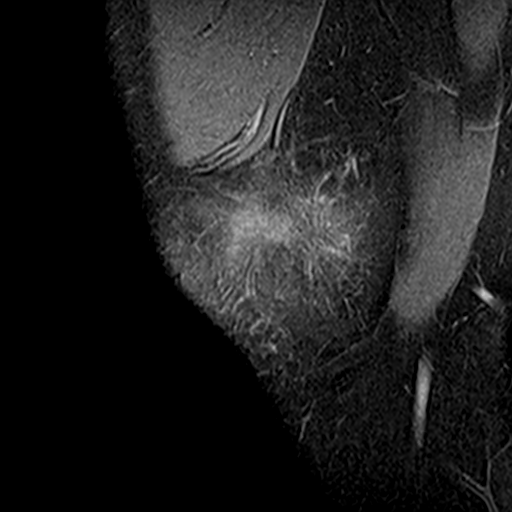
[im 6/30]
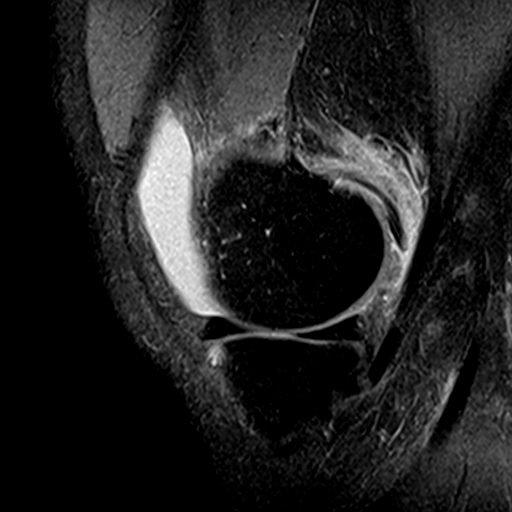
[im 12/30]
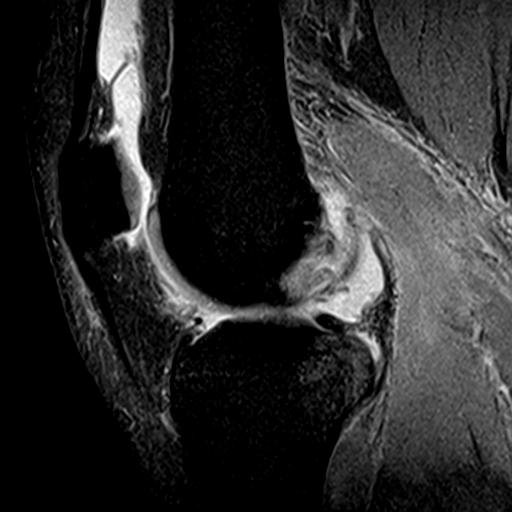
[im 18/30]
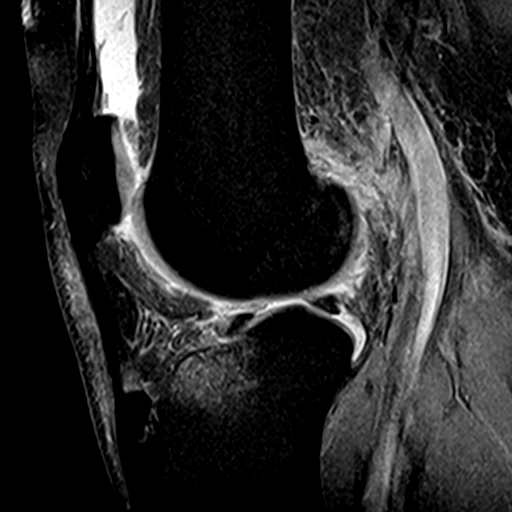
[im 30/30]
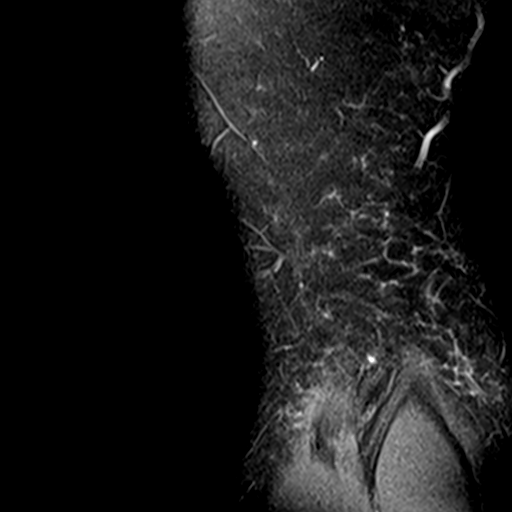

[Series 9: PD fat-sat · oblique · 2.0mm · 0.29mm/px · 3 of 13 slices shown (3 of 3)]
[im 1/13]
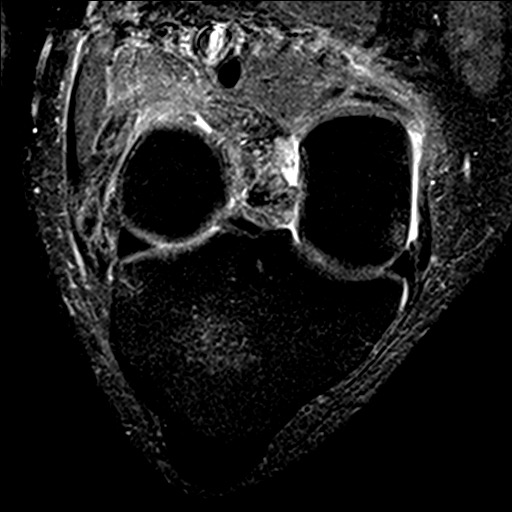
[im 7/13]
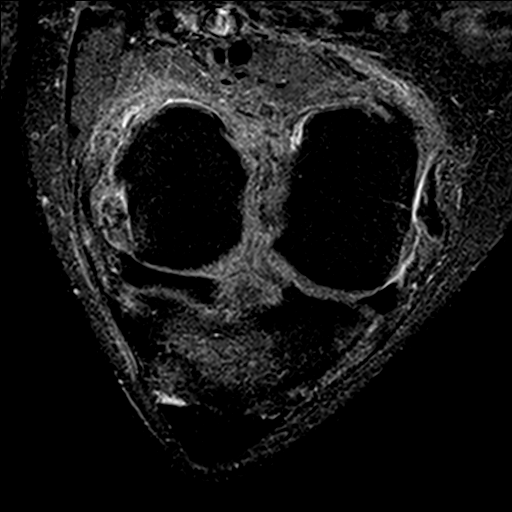
[im 13/13]
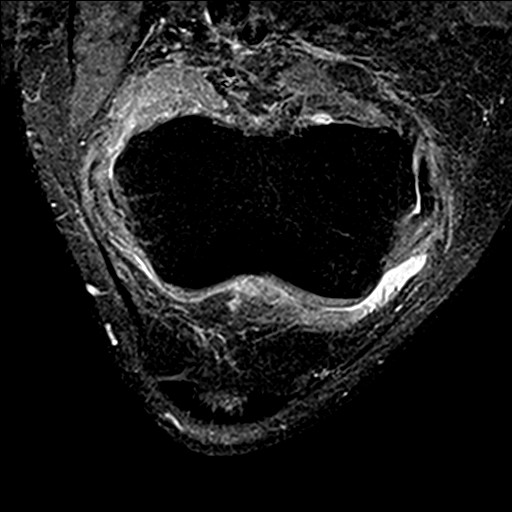

[18 of 40 positions shown; findings below may reference images not displayed]

FINDINGS: MENISCI

Medial meniscus:  Intact.

Lateral meniscus:  Intact.

LIGAMENTS

Cruciates: Complete tear of the ACL. High-grade partial tear of the
PCL.

Collaterals: Partial avulsion of the medial collateral ligament at
the femoral attachment. High-grade partial tear of the proximal
fibular collateral ligament with partial avulsion at the femoral
attachment. Avulsion of the conjoint tendon at the fibular head. The
iliotibial band is intact.

CARTILAGE

Patellofemoral:  Normal.

Medial:  Normal.

Lateral:  Normal.

Joint:  Moderate joint effusion. Normal Hoffa's fat.

Popliteal Fossa: Trace Baker cyst. Partial tear of the popliteus
tendon at the femoral attachment.

Extensor Mechanism: Intact quadriceps tendon and patellar tendon.
Intact medial and lateral patellar retinaculum. Intact MPFL.

Bones: Contusions in the peripheral and anteromedial femoral
condyle, peripheral lateral femoral condyle, peripheral medial and
lateral tibial plateau, posterior medial tibial plateau, anterior
tibial plateau, and inferior patella. No fracture. No suspicious
bone lesion.

Other: Mild edema in the proximal medial and lateral gastrocnemius
muscles, consistent with strains.
IMPRESSION: 1. Complete tear of the ACL.
2. High-grade partial tear of the PCL.
3. Partial avulsion of the medial collateral ligament at the femoral
attachment.
4. High-grade partial tear of the proximal fibular collateral
ligament with partial avulsion at the femoral attachment.
5. Avulsion of the conjoint tendon at the fibular head.
6. Partial tear of the popliteus tendon at the femoral attachment.
7. Multiple osseous contusions as described above, consistent with
history of hyperextension injury. No fracture.
